# Patient Record
Sex: Male | Born: 1995 | Race: White | Hispanic: No | Marital: Single | Smoking: Never smoker
Health system: Southern US, Community
[De-identification: ages and names within clinical notes are randomized; demographics above are authoritative.]

## PROBLEM LIST (undated history)

## (undated) HISTORY — PX: APPENDECTOMY: SHX54

---
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
## 2015-05-26 ENCOUNTER — Emergency Department (HOSPITAL_COMMUNITY): Payer: 59

## 2015-05-26 ENCOUNTER — Encounter (HOSPITAL_COMMUNITY): Payer: Self-pay | Admitting: *Deleted

## 2015-05-26 ENCOUNTER — Emergency Department (HOSPITAL_COMMUNITY)
Admission: EM | Admit: 2015-05-26 | Discharge: 2015-05-26 | Disposition: A | Discharge status: Home / Self Care | Payer: 59 | Attending: Emergency Medicine | Admitting: Emergency Medicine

## 2015-05-26 DIAGNOSIS — Z880 Allergy status to penicillin: Secondary | ICD-10-CM | POA: Diagnosis not present

## 2015-05-26 DIAGNOSIS — Y9289 Other specified places as the place of occurrence of the external cause: Secondary | ICD-10-CM | POA: Diagnosis not present

## 2015-05-26 DIAGNOSIS — S52501A Unspecified fracture of the lower end of right radius, initial encounter for closed fracture: Secondary | ICD-10-CM | POA: Diagnosis not present

## 2015-05-26 DIAGNOSIS — S5291XA Unspecified fracture of right forearm, initial encounter for closed fracture: Secondary | ICD-10-CM

## 2015-05-26 DIAGNOSIS — Y998 Other external cause status: Secondary | ICD-10-CM | POA: Insufficient documentation

## 2015-05-26 DIAGNOSIS — S52611A Displaced fracture of right ulna styloid process, initial encounter for closed fracture: Secondary | ICD-10-CM | POA: Insufficient documentation

## 2015-05-26 DIAGNOSIS — S4991XA Unspecified injury of right shoulder and upper arm, initial encounter: Secondary | ICD-10-CM | POA: Diagnosis present

## 2015-05-26 DIAGNOSIS — Z79899 Other long term (current) drug therapy: Secondary | ICD-10-CM | POA: Diagnosis not present

## 2015-05-26 DIAGNOSIS — Y9389 Activity, other specified: Secondary | ICD-10-CM | POA: Insufficient documentation

## 2015-05-26 MED ORDER — MORPHINE SULFATE (PF) 4 MG/ML IV SOLN
8.0000 mg | INTRAVENOUS | Status: DC | PRN
  Administered 2015-05-26: 8 mg via INTRAVENOUS
  Filled 2015-05-26: qty 2

## 2015-05-26 MED ORDER — KETAMINE HCL 10 MG/ML IJ SOLN
2.0000 mg/kg | Freq: Once | INTRAMUSCULAR | Status: DC
  Filled 2015-05-26: qty 1

## 2015-05-26 MED ORDER — SODIUM CHLORIDE 0.9 % IV SOLN
Freq: Once | INTRAVENOUS | Status: AC
  Administered 2015-05-26: 50 mL/h via INTRAVENOUS

## 2015-05-26 MED ORDER — HYDROCODONE-ACETAMINOPHEN 5-325 MG PO TABS: 1.0000 | ORAL_TABLET | ORAL | Status: AC | PRN

## 2015-05-26 MED ORDER — KETAMINE HCL 10 MG/ML IJ SOLN
INTRAMUSCULAR | Status: AC | PRN
  Administered 2015-05-26: 90 mg via INTRAVENOUS

## 2015-05-26 MED ORDER — ONDANSETRON HCL 4 MG/2ML IJ SOLN: 4.0000 mg | Freq: Once | INTRAMUSCULAR | Status: DC

## 2015-05-26 NOTE — ED Notes (Signed)
Pt fully awake at this time and talking to father.

## 2015-05-26 NOTE — ED Provider Notes (Signed)
CSN: [id number]     Arrival date & time 05/26/15  1536 History   First MD Initiated Contact with Patient 05/26/15 1545     Chief Complaint  Patient presents with  . Arm Injury     (Consider location/radiation/quality/duration/timing/severity/associated sxs/prior Treatment) Patient is a 19 y.o. male presenting with arm injury. The history is provided by the patient and a parent.  Arm Injury Location:  Wrist Time since incident:  30 minutes Injury: yes   Mechanism of injury: fall   Fall:    Fall occurred: from horse.   Height of fall:  4 feet   Impact surface:  Dirt   Point of impact:  Outstretched arms   Entrapped after fall: no   Wrist location:  R wrist Pain details:    Quality:  Aching   Radiates to:  Does not radiate   Severity:  Moderate   Onset quality:  Sudden   Timing:  Constant   Progression:  Unchanged Chronicity:  New Handedness:  Right-handed Foreign body present:  No foreign bodies Tetanus status:  Up to date Prior injury to area:  No Relieved by:  Nothing Worsened by:  Nothing tried Ineffective treatments:  Ice Associated symptoms: decreased range of motion (2/2 pain)   Associated symptoms: no numbness     History reviewed. No pertinent past medical history. Past Surgical History  Procedure Laterality Date  . Appendectomy     No family history on file. Social History  Substance Use Topics  . Smoking status: Never Smoker   . Smokeless tobacco: Never Used  . Alcohol Use: No    Review of Systems  All other systems reviewed and are negative.     Allergies  Penicillins  Home Medications   Prior to Admission medications   Medication Sig Start Date End Date Taking? Authorizing Provider  fluorouracil (EFUDEX) 5 % cream Apply topically 2 (two) times daily. 11/09/13   Max T Hyatt, DPM   BP 126/80 mmHg  Pulse 67  Temp(Src) 97.5 F (36.4 C) (Oral)  Resp 16  Ht  (1.778 m)  Wt 165 lb (74.844 kg)  BMI 23.68 kg/m2  SpO2 98% Physical  Exam  Constitutional: He is oriented to person, place, and time. He appears well-developed and well-nourished. No distress.  HENT:  Head: Normocephalic and atraumatic.  Eyes: Conjunctivae are normal.  Neck: Neck supple. No tracheal deviation present.  Cardiovascular: Normal rate and regular rhythm.   Pulses:      Radial pulses are 2+ on the right side.  Pulmonary/Chest: Effort normal. No respiratory distress.  Abdominal: Soft. He exhibits no distension.  Musculoskeletal:       Right wrist: He exhibits decreased range of motion, tenderness and deformity (obvious dorsal to distal radius ). Bony tenderness: with radial, median and ulnar nerve distributions in tact to sensation and movement distally.  Neurological: He is alert and oriented to person, place, and time.  Skin: Skin is warm and dry.  Psychiatric: He has a normal mood and affect.    ED Course  ORTHOPEDIC INJURY TREATMENT Date/Time: 05/26/2015 5:00 PM Performed by: Lyndal Pulley Authorized by: Lyndal Pulley Consent: Verbal consent obtained. Written consent obtained. Risks and benefits: risks, benefits and alternatives were discussed Consent given by: patient Patient understanding: patient states understanding of the procedure being performed Patient consent: the patient's understanding of the procedure matches consent given Procedure consent: procedure consent matches procedure scheduled Relevant documents: relevant documents present and verified Test results: test results available and properly  labeled Site marked: the operative site was marked Imaging studies: imaging studies available Required items: required blood products, implants, devices, and special equipment available Patient identity confirmed: verbally with patient, arm band, provided demographic data and hospital-assigned identification number Time out: Immediately prior to procedure a "time out" was called to verify the correct patient, procedure, equipment,  support staff and site/side marked as required. Injury location: forearm Location details: right forearm Injury type: fracture Fracture type: distal radius and ulnar styloid Pre-procedure neurovascular assessment: neurovascularly intact Pre-procedure distal perfusion: normal Pre-procedure neurological function: normal Pre-procedure range of motion: normal Local anesthesia used: no Patient sedated: yes Sedation type: moderate (conscious) sedation Sedatives: ketamine Analgesia: morphine Vitals: Vital signs were monitored during sedation. Manipulation performed: yes Skin traction used: no Reduction successful: yes X-ray confirmed reduction: yes Immobilization: splint Splint type: sugar tong Supplies used: Ortho-Glass Post-procedure neurovascular assessment: post-procedure neurovascularly intact Post-procedure distal perfusion: normal Post-procedure neurological function: normal Post-procedure range of motion: normal Patient tolerance: Patient tolerated the procedure well with no immediate complications   (including critical care time)  Procedural sedation Performed by: Lyndal Pulley Consent: Verbal consent obtained. Risks and benefits: risks, benefits and alternatives were discussed Required items: required blood products, implants, devices, and special equipment available Patient identity confirmed: arm band and provided demographic data Time out: Immediately prior to procedure a "time out" was called to verify the correct patient, procedure, equipment, support staff and site/side marked as required.  Sedation type: moderate (conscious) sedation NPO time confirmed and considered  Sedatives: KETAMINE   Physician Time at Bedside: 30 minutes  Vitals: Vital signs were monitored during sedation. Cardiac Monitor, pulse oximeter Patient tolerance: Patient tolerated the procedure well with no immediate complications. Comments: Pt with uneventful recovered. Returned to  pre-procedural sedation baseline  Labs Review Labs Reviewed - No data to display  Imaging Review Dg Forearm Right  05/26/2015   CLINICAL DATA:  19 year old male with a history of right arm pain after horse riding accident.  EXAM: RIGHT FOREARM - 2 VIEW  COMPARISON:  None.  FINDINGS: Acute transverse fracture of the distal radial meta-diaphysis with full width dorsal displacement at the fracture site and associated soft tissue deformity/ contour deformity. Small calcific densities distal to the ulna may reflect small fracture fragments on the AP view.  Soft tissue swelling.  IMPRESSION: Acute transverse fracture at the distal radial meta-diaphysis with full width dorsal displacement of the distal fracture fragment and associated deformity. Questionable small fracture fragments distal to the ulna.  Signed,  Yvone Neu. Loreta Ave, DO  [facility]  [facility]   Electronically Signed   By: Gilmer Mor D.O.   On: 05/26/2015 16:42   Dg Wrist Complete Right  05/26/2015   CLINICAL DATA:  19 year old male with history of distal radial fracture status post reduction. History of trauma after being thrown off a horse.  EXAM: RIGHT WRIST - COMPLETE 3+ VIEW  COMPARISON:  05/26/2015.  FINDINGS: Compared to the prior examination there has been interval closed reduction of the previously noted dorsally displaced distal radial fracture through the meta diaphysis. This fracture appears nearly completely reduced with restoration of near anatomic alignment. Subtle ulnar styloid avulsion fracture is also noted. Overlying plaster cast slightly obscures finer bony detail. No additional acute fractures are noted.  IMPRESSION: 1. Status post close reduction and cast fixation for distal radial and ulnar fractures, with improved near anatomic alignment, as above.   Electronically Signed   By: Brayton Mars.D.  On: 05/26/2015 18:17   Dg C-arm 1-60 Min  05/26/2015   CLINICAL DATA:   Close reduction of radius  EXAM: DG C-ARM 61-120 MIN  COMPARISON:  05/26/2015  FINDINGS: Single lateral spot image of the right wrist demonstrates improved alignment at the distal radial fracture in this single projection.  IMPRESSION: Improved alignment on this single lateral projection.   Electronically Signed   By: Charlett Nose M.D.   On: 05/26/2015 18:02   I have personally reviewed and evaluated these images and lab results as part of my medical decision-making.   EKG Interpretation None      MDM   Final diagnoses:  Distal radius fracture, right, closed, initial encounter    19 year old male presents after falling on outstretched right hand from a horse. No loss of consciousness or other signs of injury currently. Has obvious dinner fork deformity to right forearm with good distal perfusion and neurologically intact distal to injury. Fracture evident on plain film consistent with injury pattern. Patient was sedated and reduced as documented above with improvement of alignment.  Discussed with Dr. Rayburn Ma ortho o/c for cone who reviewed pre-and post reduction films with me and he agreed that alignment is satisfactory for outpatient follow-up. The patient is a Archivist out of town and has the option of following up with Dr. Melvyn Novas of hand surgery here or with orthopedic surgery in an area closer to his school within the week. Return precautions were discussed for recurrent injury, signs of compartment syndrome or decreased perfusion to limb.  Lyndal Pulley, MD 05/26/15 [phone]

## 2015-05-26 NOTE — Discharge Instructions (Signed)
Forearm Fracture °Your caregiver has diagnosed you as having a broken bone (fracture) of the forearm. This is the part of your arm between the elbow and your wrist. Your forearm is made up of two bones. These are the radius and ulna. A fracture is a break in one or both bones. A cast or splint is used to protect and keep your injured bone from moving. The cast or splint will be on generally for about 5 to 6 weeks, with individual variations. °HOME CARE INSTRUCTIONS  °· Keep the injured part elevated while sitting or lying down. Keeping the injury above the level of your heart (the center of the chest). This will decrease swelling and pain. °· Apply ice to the injury for 15-20 minutes, 03-04 times per day while awake, for 2 days. Put the ice in a plastic bag and place a thin towel between the bag of ice and your cast or splint. °· If you have a plaster or fiberglass cast: °¨ Do not try to scratch the skin under the cast using sharp or pointed objects. °¨ Check the skin around the cast every day. You may put lotion on any red or sore areas. °¨ Keep your cast dry and clean. °· If you have a plaster splint: °¨ Wear the splint as directed. °¨ You may loosen the elastic around the splint if your fingers become numb, tingle, or turn cold or blue. °· Do not put pressure on any part of your cast or splint. It may break. Rest your cast only on a pillow the first 24 hours until it is fully hardened. °· Your cast or splint can be protected during bathing with a plastic bag. Do not lower the cast or splint into water. °· Only take over-the-counter or prescription medicines for pain, discomfort, or fever as directed by your caregiver. °SEEK IMMEDIATE MEDICAL CARE IF:  °· Your cast gets damaged or breaks. °· You have more severe pain or swelling than you did before the cast. °· Your skin or nails below the injury turn blue or gray, or feel cold or numb. °· There is a bad smell or new stains and/or pus like (purulent) drainage  coming from under the cast. °MAKE SURE YOU:  °· Understand these instructions. °· Will watch your condition. °· Will get help right away if you are not doing well or get worse. °Document Released: 08/15/2000 Document Revised: 11/10/2011 Document Reviewed: 04/06/2008 °[employer]® Patient Information ©2015 [employer]. This information is not intended to replace advice given to you by your health care provider. Make sure you discuss any questions you have with your health care provider. ° °

## 2015-05-26 NOTE — Sedation Documentation (Signed)
Patient denies pain and is resting comfortably.  

## 2015-05-26 NOTE — ED Notes (Signed)
Pt was thrown form a horse. Obvious deformity to right lower/ wrist area arm.

## 2017-04-06 IMAGING — RF DG C-ARM 61-120 MIN
1 series · 1 of 1 positions shown · non-contrast
Comparison: 05/26/2015

CLINICAL DATA: Close reduction of radius

EXAM:
DG C-ARM 61-120 MIN

[Series 1: run · 1 of 1 slices shown]
[im 1/1]
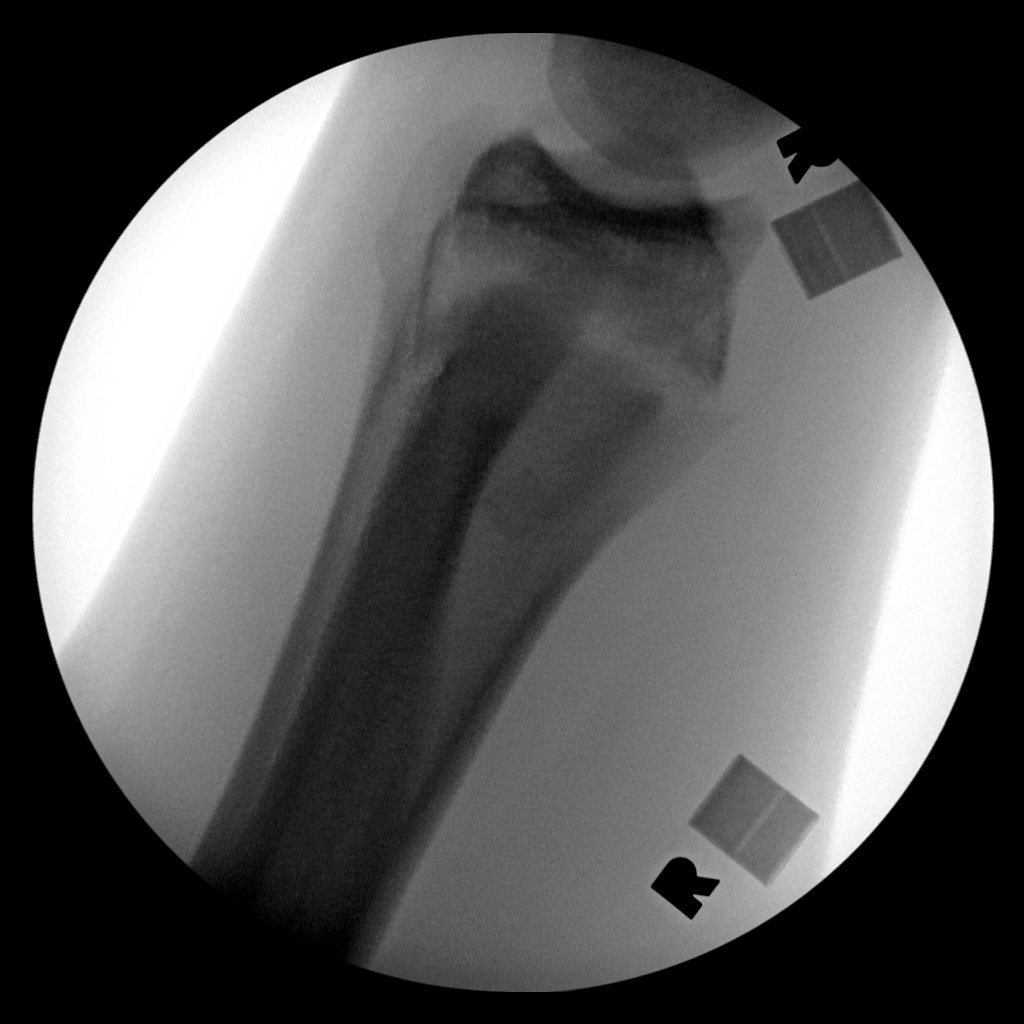

[1 of 1 positions shown; findings below may reference images not displayed]

FINDINGS: Single lateral spot image of the right wrist demonstrates improved
alignment at the distal radial fracture in this single projection.
IMPRESSION: Improved alignment on this single lateral projection.

## 2017-04-06 IMAGING — CR DG WRIST COMPLETE 3+V*R*
1 series · 2 of 2 positions shown · non-contrast
Comparison: 05/26/2015.

CLINICAL DATA: 18-year-old male with history of distal radial
fracture status post reduction. History of trauma after being thrown
off a horse.

EXAM:
RIGHT WRIST - COMPLETE 3+ VIEW

[Series 2: pa · 0.17mm/px · 2 of 2 slices shown]
[im 1/2]
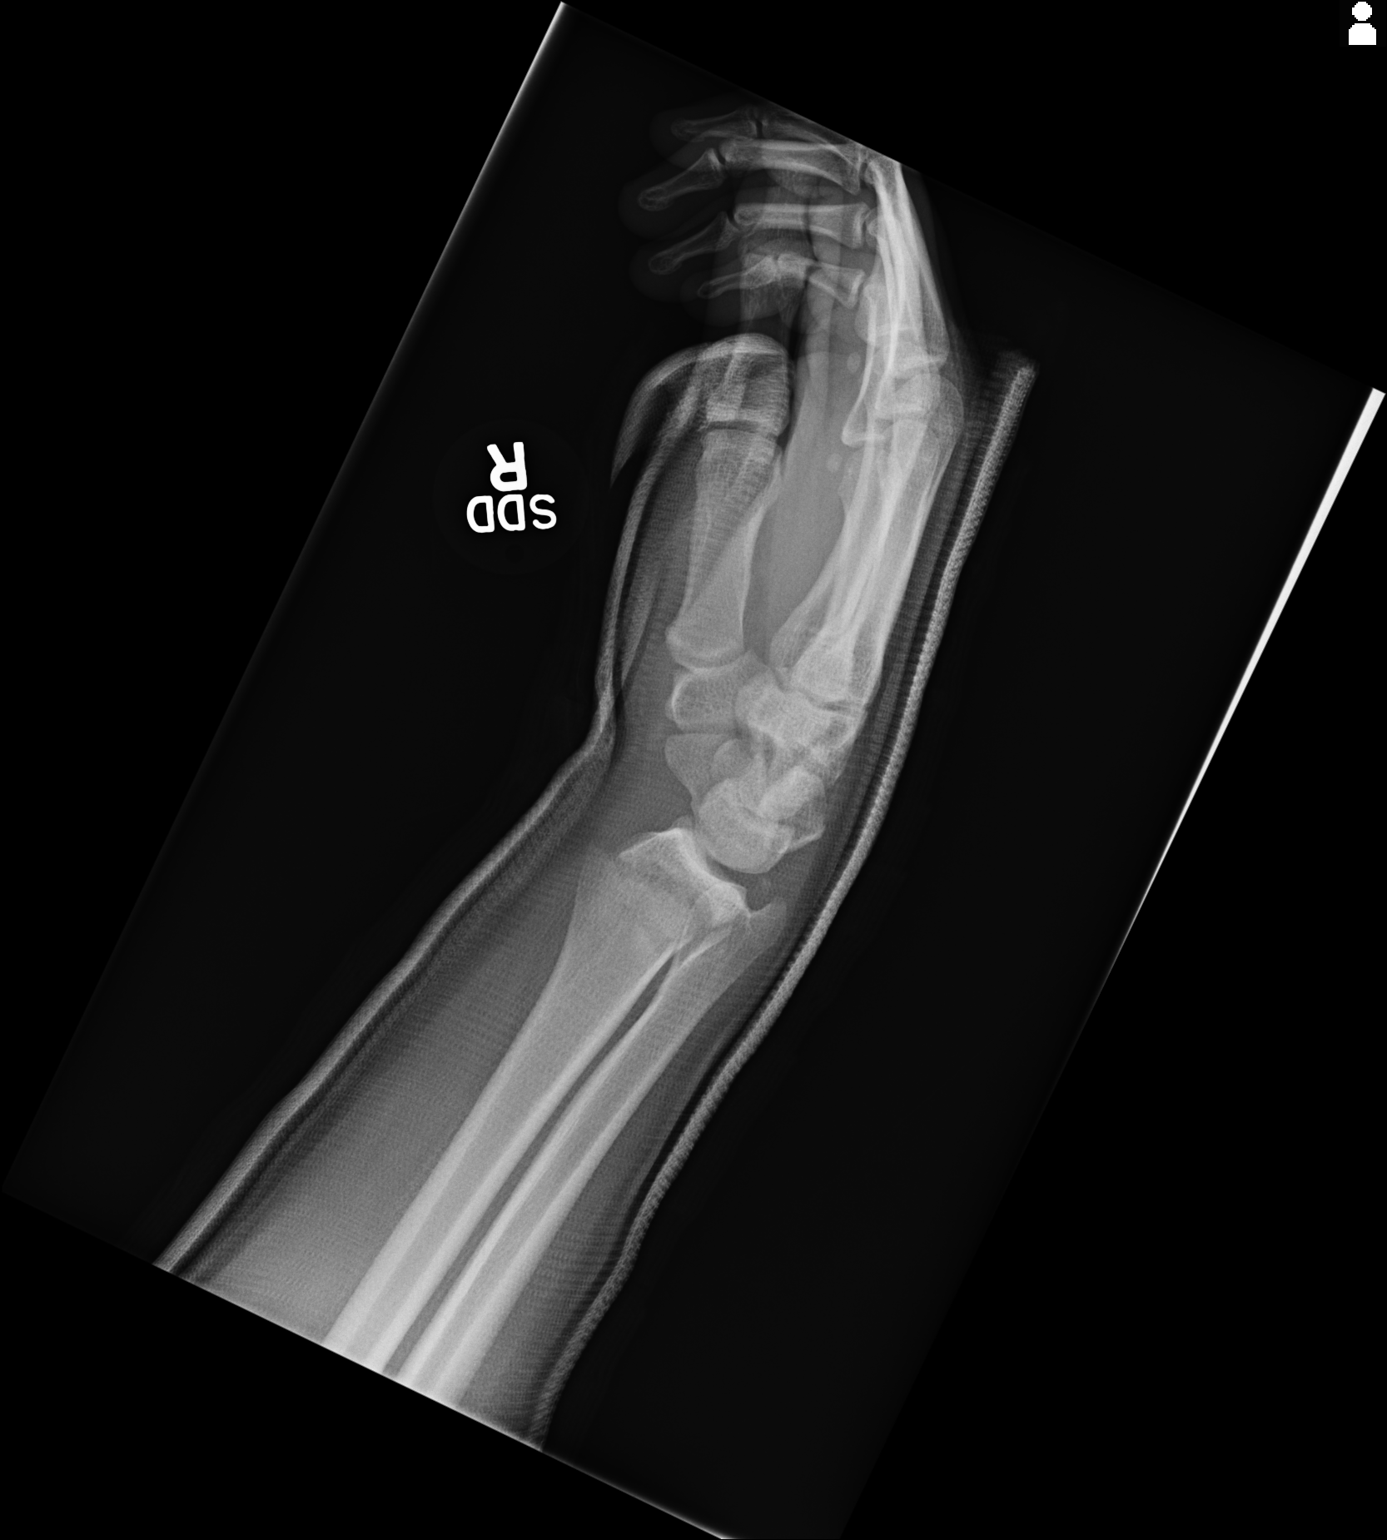
[im 2/2]
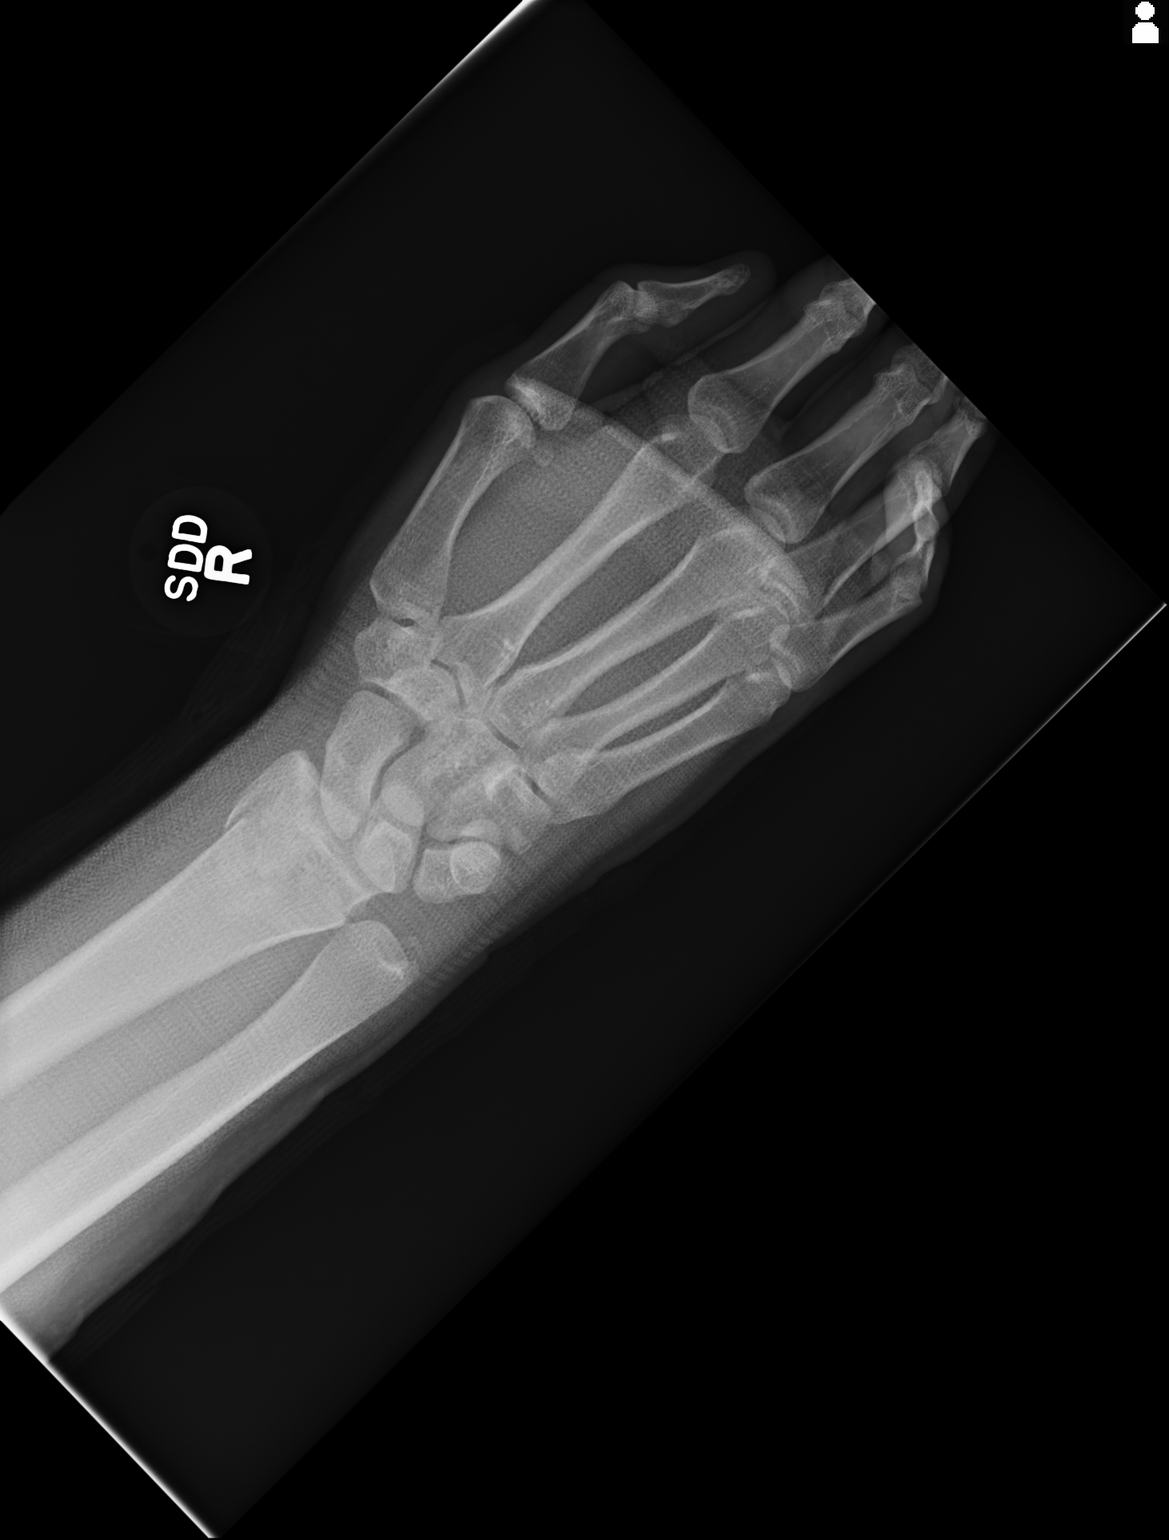

[2 of 2 positions shown; findings below may reference images not displayed]

FINDINGS: Compared to the prior examination there has been interval closed
reduction of the previously noted dorsally displaced distal radial
fracture through the meta diaphysis. This fracture appears nearly
completely reduced with restoration of near anatomic alignment.
Subtle ulnar styloid avulsion fracture is also noted. Overlying
plaster cast slightly obscures finer bony detail. No additional
acute fractures are noted.
IMPRESSION: 1. Status post close reduction and cast fixation for distal radial
and ulnar fractures, with improved near anatomic alignment, as
above.

## 2018-03-06 DIAGNOSIS — J019 Acute sinusitis, unspecified: Secondary | ICD-10-CM | POA: Diagnosis not present

## 2018-10-15 DIAGNOSIS — H52223 Regular astigmatism, bilateral: Secondary | ICD-10-CM | POA: Diagnosis not present

## 2018-10-15 DIAGNOSIS — H5213 Myopia, bilateral: Secondary | ICD-10-CM | POA: Diagnosis not present
